# Patient Record
Sex: Female | Born: 1990
Health system: Southern US, Community
[De-identification: ages and names within clinical notes are randomized; demographics above are authoritative.]

## PROBLEM LIST (undated history)

## (undated) DIAGNOSIS — T7840XA Allergy, unspecified, initial encounter: Secondary | ICD-10-CM

## (undated) HISTORY — DX: Allergy, unspecified, initial encounter: T78.40XA

---

## 2012-04-09 ENCOUNTER — Ambulatory Visit
Admission: RE | Admit: 2012-04-09 | Discharge: 2012-04-09 | Disposition: A | Discharge status: Home / Self Care | Payer: No Typology Code available for payment source | Source: Ambulatory Visit | Attending: Family Medicine | Admitting: Family Medicine

## 2012-04-09 ENCOUNTER — Other Ambulatory Visit: Payer: No Typology Code available for payment source

## 2012-04-09 ENCOUNTER — Other Ambulatory Visit: Payer: Self-pay | Admitting: Family Medicine

## 2012-04-09 DIAGNOSIS — R52 Pain, unspecified: Secondary | ICD-10-CM

## 2012-05-07 ENCOUNTER — Ambulatory Visit
Admission: RE | Admit: 2012-05-07 | Discharge: 2012-05-07 | Disposition: A | Discharge status: Home / Self Care | Payer: Medicaid Other | Source: Ambulatory Visit | Attending: Orthopedic Surgery | Admitting: Orthopedic Surgery

## 2012-05-07 ENCOUNTER — Other Ambulatory Visit: Payer: Self-pay | Admitting: Orthopedic Surgery

## 2012-05-07 DIAGNOSIS — T148XXA Other injury of unspecified body region, initial encounter: Secondary | ICD-10-CM

## 2014-03-28 IMAGING — CR DG HAND COMPLETE 3+V*L*
3 series · 3 of 3 positions shown · non-contrast
Comparison: None.

***ADDENDUM*** CREATED: 04/09/2012 [DATE]

The findings of the report below are correct.  The fracture
involves the middle phalanx of the ring finger.  The impression
incorrectly reads the middle phalanx of the middle finger.  The
fracture is indeed of the ring finger.
CLINICAL DATA: Injury to left ring finger
LEFT HAND - COMPLETE 3+ VIEW

[view not recorded (1 of 3)]
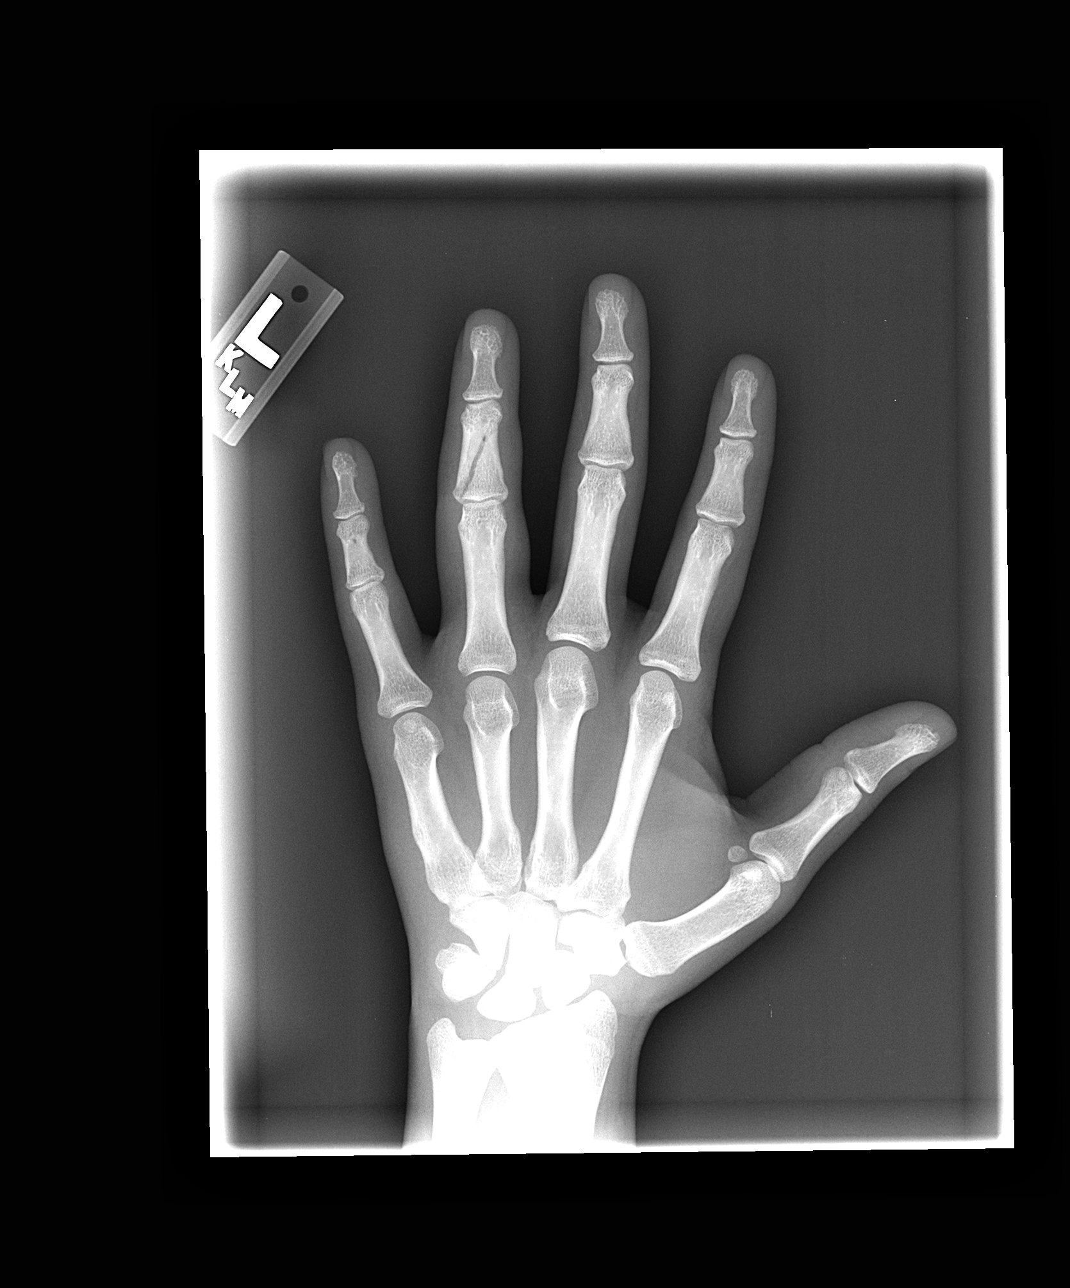

[view not recorded (2 of 3)]
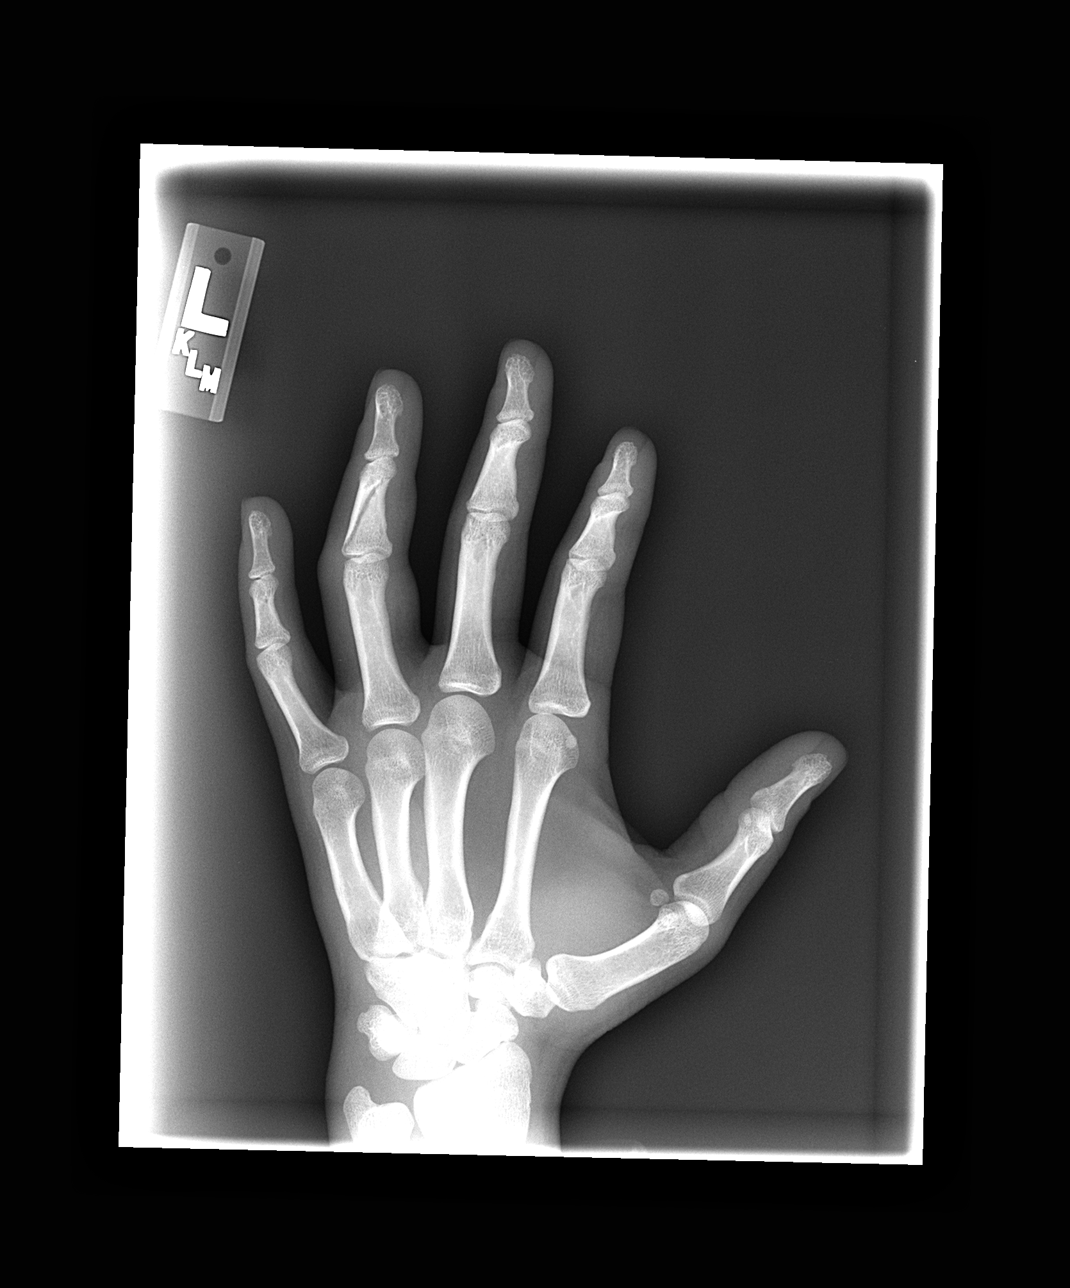

[view not recorded (3 of 3)]
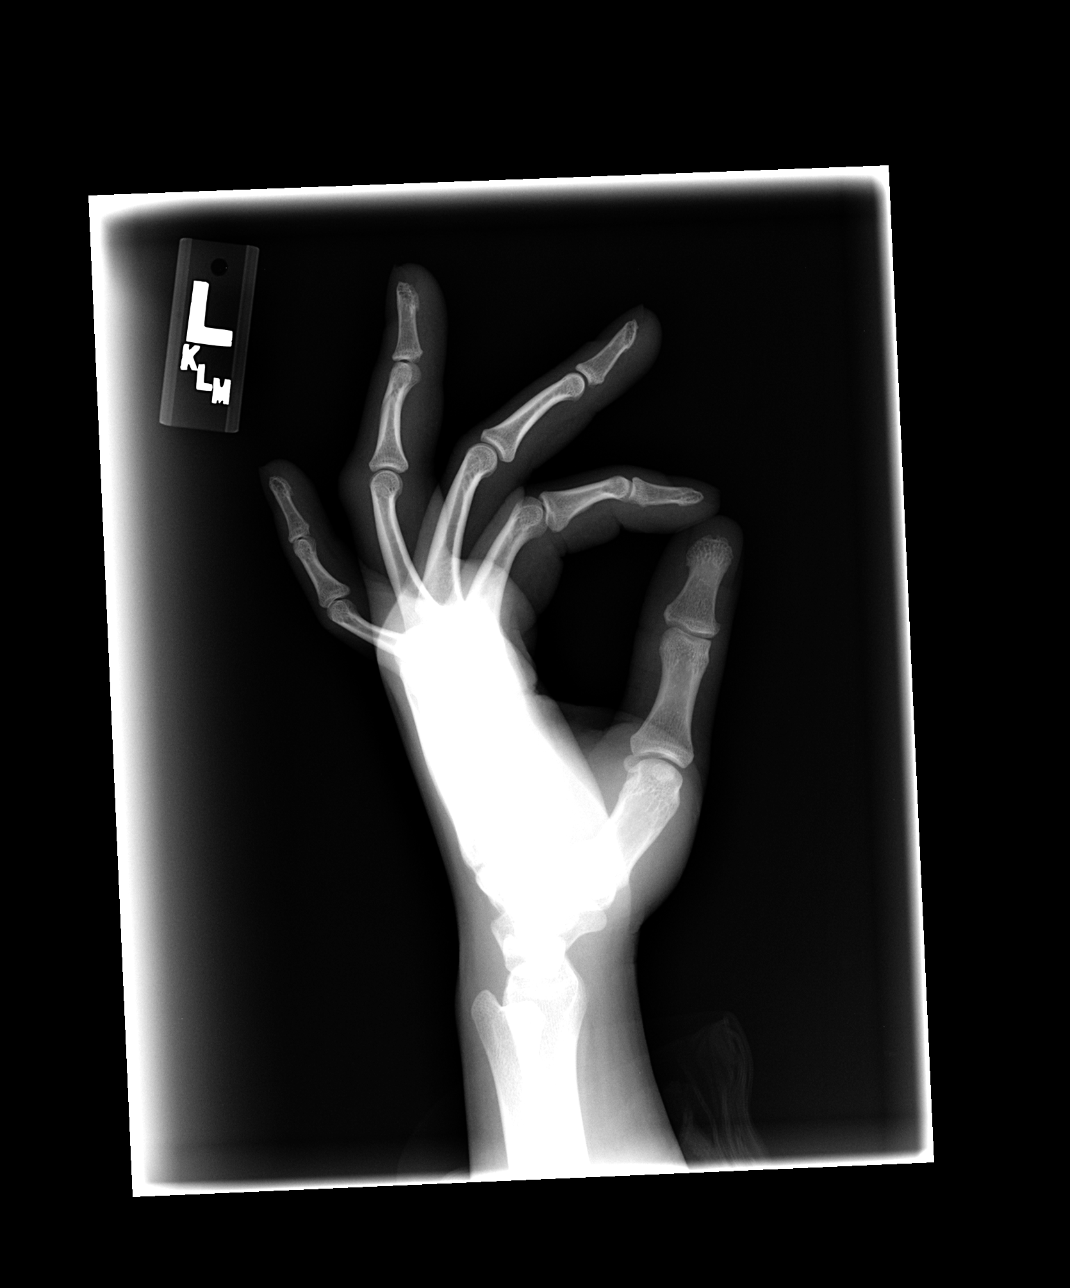

[3 of 3 positions shown; findings below may reference images not displayed]

FINDINGS: Acute obliquely oriented fracture through the middle
phalanx of the ring finger extending to the proximal articular
surface.  There is associated soft tissue swelling.  The fracture
fragments are nondisplaced.  The remainder the visualized bones and
joints are unremarkable.
IMPRESSION: Acute, non displaced obliquely oriented intra-articular fracture
through the middle phalanx of the middle finger with associated
soft tissue swelling.

## 2014-04-25 IMAGING — CR DG FINGER RING 2+V*L*
1 series · 1 of 1 positions shown · non-contrast
Comparison: 04/09/2012.

CLINICAL DATA: Follow-up fracture.

LEFT RING FINGER 2+V

[view not recorded]
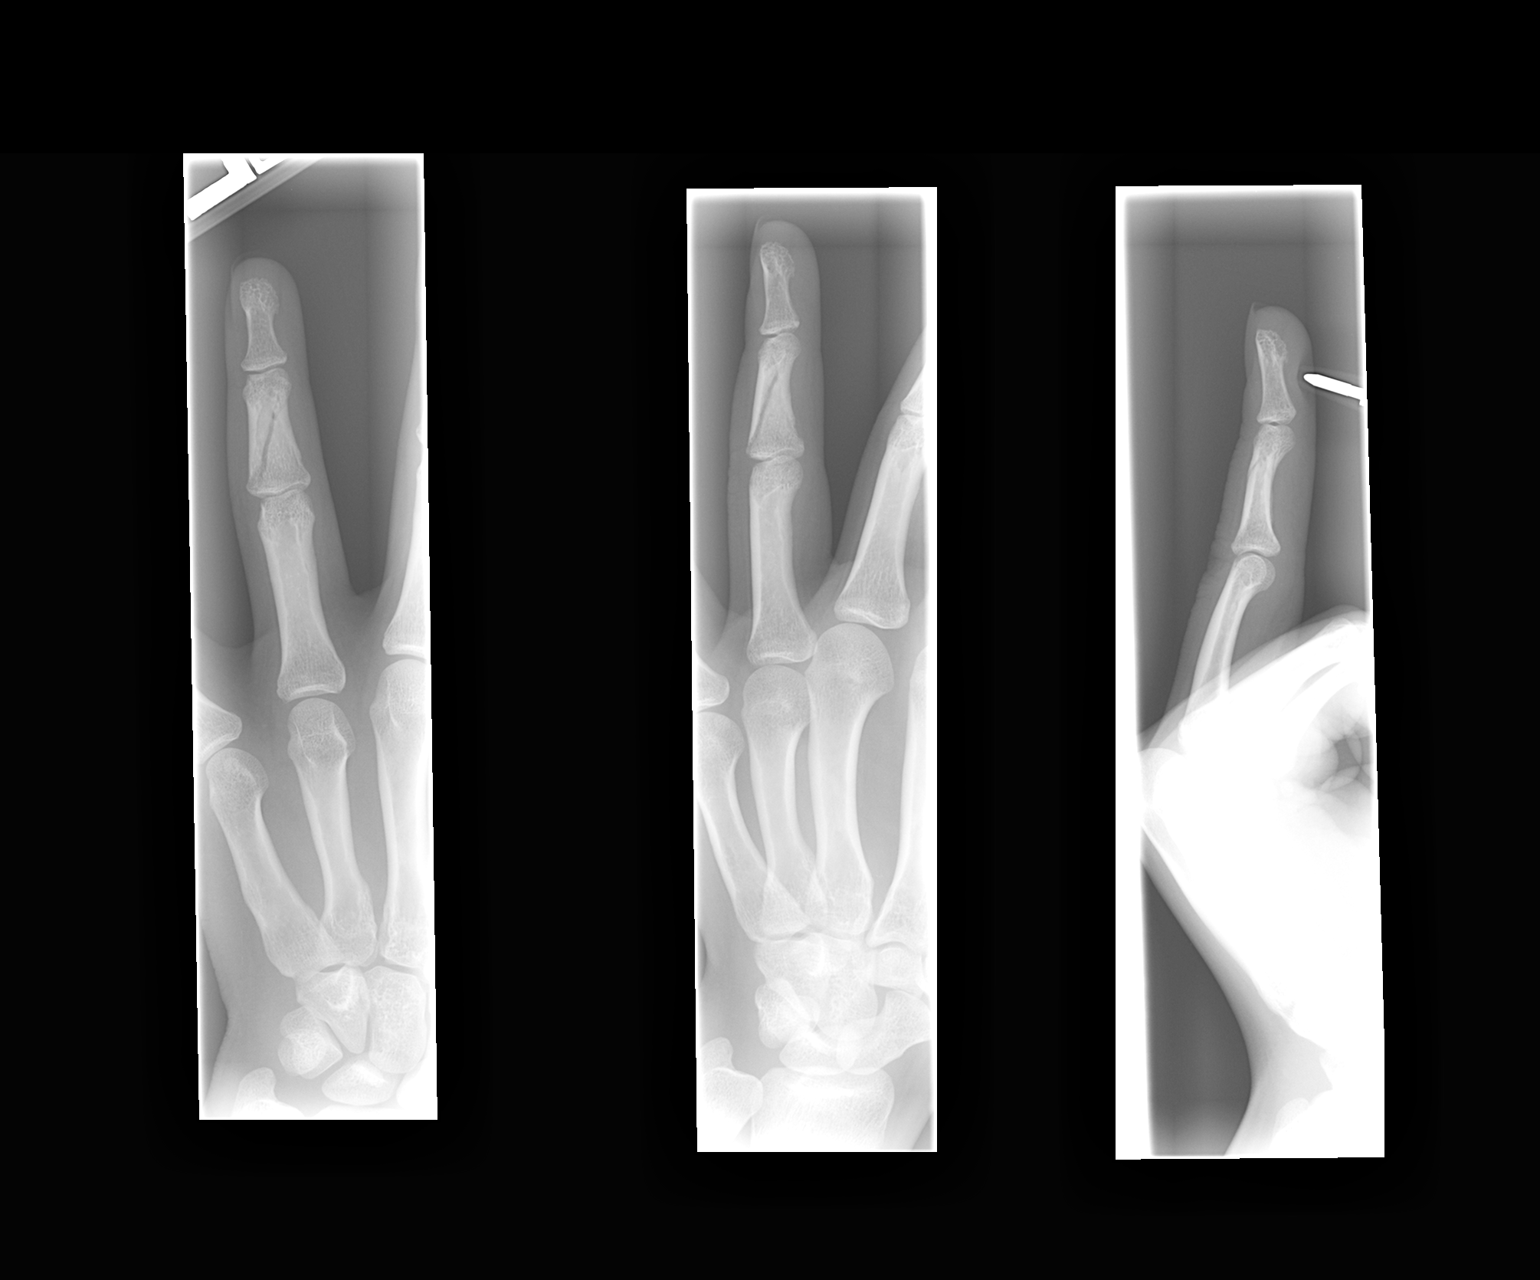

[1 of 1 positions shown; findings below may reference images not displayed]

FINDINGS: There is some bony ingrowth across the fracture site.  No
obvious callus formation.  The joint spaces are maintained.
IMPRESSION: Some interval bony healing across the fracture.

## 2022-09-08 ENCOUNTER — Ambulatory Visit: Payer: 59 | Admitting: Nurse Practitioner

## 2022-10-09 ENCOUNTER — Emergency Department (HOSPITAL_COMMUNITY): Payer: 59

## 2022-10-09 ENCOUNTER — Emergency Department (HOSPITAL_COMMUNITY)
Admission: EM | Admit: 2022-10-09 | Discharge: 2022-10-10 | Disposition: A | Discharge status: Home / Self Care | Payer: 59 | Attending: Emergency Medicine | Admitting: Emergency Medicine

## 2022-10-09 ENCOUNTER — Encounter (HOSPITAL_COMMUNITY): Payer: Self-pay

## 2022-10-09 DIAGNOSIS — R519 Headache, unspecified: Secondary | ICD-10-CM | POA: Insufficient documentation

## 2022-10-09 LAB — BASIC METABOLIC PANEL
Anion gap: 10 (ref 5–15)
BUN: 12 mg/dL (ref 6–20)
CO2: 23 mmol/L (ref 22–32)
Calcium: 9.6 mg/dL (ref 8.9–10.3)
Chloride: 103 mmol/L (ref 98–111)
Creatinine, Ser: 0.78 mg/dL (ref 0.44–1.00)
GFR, Estimated: >60 mL/min (ref >60)
Glucose, Bld: 98 mg/dL (ref 70–99)
Potassium: 4 mmol/L (ref 3.5–5.1)
Sodium: 136 mmol/L (ref 135–145)

## 2022-10-09 LAB — CBC WITH DIFFERENTIAL/PLATELET
Abs Immature Granulocytes: 0 10*3/uL (ref 0.00–0.07)
Basophils Absolute: 0.1 10*3/uL (ref 0.0–0.1)
Basophils Relative: 1 %
Eosinophils Absolute: 0.1 10*3/uL (ref 0.0–0.5)
Eosinophils Relative: 2 %
HCT: 41.3 % (ref 36.0–46.0)
Hemoglobin: 13.7 g/dL (ref 12.0–15.0)
Immature Granulocytes: 0 %
Lymphocytes Relative: 47 %
Lymphs Abs: 2.5 10*3/uL (ref 0.7–4.0)
MCH: 28.9 pg (ref 26.0–34.0)
MCHC: 33.2 g/dL (ref 30.0–36.0)
MCV: 87.1 fL (ref 80.0–100.0)
Monocytes Absolute: 0.8 10*3/uL (ref 0.1–1.0)
Monocytes Relative: 16 %
Neutro Abs: 1.8 10*3/uL (ref 1.7–7.7)
Neutrophils Relative %: 34 %
Platelets: 355 10*3/uL (ref 150–400)
RBC: 4.74 MIL/uL (ref 3.87–5.11)
RDW: 12.8 % (ref 11.5–15.5)
WBC: 5.3 10*3/uL (ref 4.0–10.5)
nRBC: 0 % (ref 0.0–0.2)

## 2022-10-09 LAB — I-STAT BETA HCG BLOOD, ED (MC, WL, AP ONLY): I-stat hCG, quantitative: <5 m[IU]/mL (ref <5)

## 2022-10-09 NOTE — ED Provider Notes (Signed)
[place] EMERGENCY DEPARTMENT AT [facility] Provider Note   CSN: [id number] Arrival date & time: 10/09/22  1512     History  No chief complaint on file.   Shirley Chase is a 32 y.o. female.  Patient with a history of pseudotumor cerebri here with 2 weeks of ongoing intermittent headaches.  Headache is diffuse, gradual in onset, persistent, improves with Excedrin.  Not having a headache currently.  Headache is gradual in onset.  No associated nausea, vomiting, fever, visual changes, photophobia or phonophobia.  No focal weakness, numbness or tingling.  No chest pain or shortness of breath.  States she saw optometrist in February and was told she had "tissue behind her right eye" and needed an MRI.  Denies having papilledema or elevated eye pressures.  States she is here because she is still having headaches and wants to have an MRI.  Currently she has no headache or visual changes.  No focal weakness, numbness or tingling.  No photophobia or phonophobia.  States she has required a lumbar puncture previously for "fluid behind my left eye".  The history is provided by the patient.       Home Medications Prior to Admission medications   Not on File      Allergies    Patient has no known allergies.    Review of Systems   Review of Systems  Constitutional:  Negative for activity change, appetite change and fever.  HENT:  Negative for congestion.   Eyes:  Negative for photophobia and visual disturbance.  Respiratory:  Negative for cough, chest tightness and shortness of breath.   Cardiovascular:  Negative for chest pain.  Gastrointestinal:  Negative for abdominal pain, nausea and vomiting.  Genitourinary:  Negative for dysuria.  Musculoskeletal:  Negative for arthralgias and myalgias.  Skin:  Negative for rash.  Neurological:  Positive for headaches. Negative for dizziness and weakness.  Psychiatric/Behavioral:  Negative for agitation. The patient is not  nervous/anxious.    all other systems are negative except as noted in the HPI and PMH.    Physical Exam Updated Vital Signs BP (!) 160/109 (BP Location: Right Arm)   Pulse 78   Temp 99.5 F (37.5 C)   Resp 16   SpO2 99%  Physical Exam Vitals and nursing note reviewed.  Constitutional:      General: She is not in acute distress.    Appearance: She is well-developed.  HENT:     Head: Normocephalic and atraumatic.     Mouth/Throat:     Pharynx: No oropharyngeal exudate.  Eyes:     Conjunctiva/sclera: Conjunctivae normal.     Pupils: Pupils are equal, round, and reactive to light.  Neck:     Comments: No meningismus. Cardiovascular:     Rate and Rhythm: Normal rate and regular rhythm.     Heart sounds: Normal heart sounds. No murmur heard. Pulmonary:     Effort: Pulmonary effort is normal. No respiratory distress.     Breath sounds: Normal breath sounds.  Abdominal:     Palpations: Abdomen is soft.     Tenderness: There is no abdominal tenderness. There is no guarding or rebound.  Musculoskeletal:        General: No tenderness. Normal range of motion.     Cervical back: Normal range of motion and neck supple.  Skin:    General: Skin is warm.  Neurological:     Mental Status: She is alert and oriented to person, place, and time.  Cranial Nerves: No cranial nerve deficit.     Motor: No abnormal muscle tone.     Coordination: Coordination normal.     Comments:  5/5 strength throughout. CN 2-12 intact.Equal grip strength.  Visual fields full to confrontation bilaterally.  Psychiatric:        Behavior: Behavior normal.     ED Results / Procedures / Treatments   Labs (all labs ordered are listed, but only abnormal results are displayed) Labs Reviewed  BASIC METABOLIC PANEL  CBC WITH DIFFERENTIAL/PLATELET  CBC WITH DIFFERENTIAL/PLATELET  I-STAT BETA HCG BLOOD, ED ([facility], [facility], [facility] ONLY)    EKG None  Radiology MR Brain W and Wo Contrast  Result Date:  10/10/2022 CLINICAL DATA:  Initial evaluation for headache. EXAM: MRI HEAD WITHOUT AND WITH CONTRAST MRV HEAD WITHOUT AND WITH CONTRAST TECHNIQUE: Multiplanar, multiecho pulse sequences of the brain and surrounding structures were obtained without and with intravenous contrast. Angiographic images of the intracranial venous structures were obtained using MRV technique without intravenous contrast. CONTRAST:  7.7mL GADAVIST GADOBUTROL 1 MMOL/ML IV SOLN COMPARISON:  CT from earlier the same day. FINDINGS: MRI HEAD FINDINGS Brain: Cerebral volume within normal limits for age. No focal parenchymal signal abnormality. No abnormal foci of restricted diffusion to suggest acute or subacute ischemia. Gray-white matter differentiation well maintained. No encephalomalacia to suggest chronic cortical infarction or other insult. No foci of susceptibility artifact indicative of acute or chronic intracranial blood products. No mass lesion, midline shift or mass effect. Ventricles normal in size and morphology without hydrocephalus. No extra-axial fluid collection. Pituitary gland and suprasellar region within normal limits. No abnormal enhancement. Vascular: Major intracranial vascular flow voids are well maintained. Skull and upper cervical spine: Craniocervical junction within normal limits. Visualized upper cervical spine demonstrates no significant finding. Bone marrow signal intensity within normal limits. No scalp soft tissue abnormality. Sinuses/Orbits: Globes and orbital soft tissues are within normal limits. Paranasal sinuses are largely clear. No significant mastoid effusion. Other: None. MRV HEAD FINDINGS Normal flow related signal and enhancement seen throughout the superior sagittal sinus to the torcula. Transverse and sigmoid sinuses are patent as are the jugular bulbs and visualized proximal internal jugular veins. Left transverse sinus dominant. Focal stenoses noted at the junctions of the transverse and sigmoid  sinuses bilaterally. Straight sinus, vein of Galen, internal cerebral veins, and basal veins of Rosenthal are patent. No appreciable abnormality about the cavernous sinus. No appreciable cortical vein thrombosis. IMPRESSION: MRI HEAD IMPRESSION: Normal brain MRI. No acute intracranial abnormality. MRV HEAD IMPRESSION: 1. Negative intracranial MRV for dural sinus thrombosis. 2. Focal stenoses at the junctions of the transverse and sigmoid sinuses bilaterally which can be seen in the setting of idiopathic intracranial hypertension. Electronically Signed   By: Rise Mu M.D.   On: 10/10/2022 03:10   MR Venogram Head  Result Date: 10/10/2022 CLINICAL DATA:  Initial evaluation for headache. EXAM: MRI HEAD WITHOUT AND WITH CONTRAST MRV HEAD WITHOUT AND WITH CONTRAST TECHNIQUE: Multiplanar, multiecho pulse sequences of the brain and surrounding structures were obtained without and with intravenous contrast. Angiographic images of the intracranial venous structures were obtained using MRV technique without intravenous contrast. CONTRAST:  7.68mL GADAVIST GADOBUTROL 1 MMOL/ML IV SOLN COMPARISON:  CT from earlier the same day. FINDINGS: MRI HEAD FINDINGS Brain: Cerebral volume within normal limits for age. No focal parenchymal signal abnormality. No abnormal foci of restricted diffusion to suggest acute or subacute ischemia. Gray-white matter differentiation well maintained. No encephalomalacia to suggest chronic cortical infarction or  other insult. No foci of susceptibility artifact indicative of acute or chronic intracranial blood products. No mass lesion, midline shift or mass effect. Ventricles normal in size and morphology without hydrocephalus. No extra-axial fluid collection. Pituitary gland and suprasellar region within normal limits. No abnormal enhancement. Vascular: Major intracranial vascular flow voids are well maintained. Skull and upper cervical spine: Craniocervical junction within normal limits.  Visualized upper cervical spine demonstrates no significant finding. Bone marrow signal intensity within normal limits. No scalp soft tissue abnormality. Sinuses/Orbits: Globes and orbital soft tissues are within normal limits. Paranasal sinuses are largely clear. No significant mastoid effusion. Other: None. MRV HEAD FINDINGS Normal flow related signal and enhancement seen throughout the superior sagittal sinus to the torcula. Transverse and sigmoid sinuses are patent as are the jugular bulbs and visualized proximal internal jugular veins. Left transverse sinus dominant. Focal stenoses noted at the junctions of the transverse and sigmoid sinuses bilaterally. Straight sinus, vein of Galen, internal cerebral veins, and basal veins of Rosenthal are patent. No appreciable abnormality about the cavernous sinus. No appreciable cortical vein thrombosis. IMPRESSION: MRI HEAD IMPRESSION: Normal brain MRI. No acute intracranial abnormality. MRV HEAD IMPRESSION: 1. Negative intracranial MRV for dural sinus thrombosis. 2. Focal stenoses at the junctions of the transverse and sigmoid sinuses bilaterally which can be seen in the setting of idiopathic intracranial hypertension. Electronically Signed   By: Rise MuBenjamin  McClintock M.D.   On: 10/10/2022 03:10   CT Head Wo Contrast  Result Date: 10/09/2022 CLINICAL DATA:  Headache EXAM: CT HEAD WITHOUT CONTRAST TECHNIQUE: Contiguous axial images were obtained from the base of the skull through the vertex without intravenous contrast. RADIATION DOSE REDUCTION: This exam was performed according to the departmental dose-optimization program which includes automated exposure control, adjustment of the mA and/or kV according to patient size and/or use of iterative reconstruction technique. COMPARISON:  None Available. FINDINGS: Brain: No evidence of acute infarction, hemorrhage, hydrocephalus, extra-axial collection or mass lesion/mass effect. Vascular: No hyperdense vessel or unexpected  calcification. Skull: Normal. Negative for fracture or focal lesion. Sinuses/Orbits: No acute finding. Other: None. IMPRESSION: No acute intracranial abnormality. Electronically Signed   By: Darliss CheneyAmy  Guttmann M.D.   On: 10/09/2022 17:16    Procedures Procedures    Medications Ordered in ED Medications - No data to display  ED Course/ Medical Decision Making/ A&P                             Medical Decision Making Amount and/or Complexity of Data Reviewed Labs: ordered. Decision-making details documented in ED Course. Radiology: ordered and independent interpretation performed. Decision-making details documented in ED Course. ECG/medicine tests: ordered and independent interpretation performed. Decision-making details documented in ED Course.  Risk Prescription drug management.   Intermittent headaches for 2 weeks, none currently.  Normal neurological exam.  No visual changes.  CT head obtained in triage is negative for acute findings.  Results reviewed and interpreted by me.   Visual Acuity R Distance: 10/20 L Distance: 10/20   MRI brain is normal.  MRV shows some stenosis of transverse and sagittal sinuses.  Patient remains headache free.  Her vision is normal.  Feel she does not need emergent lumbar puncture given lack of headache and lack of visual changes.  Patient agrees.  Will refer to neurology for outpatient follow-up.  She remains headache free.  Return to the ED with worsening headache, visual changes, unilateral weakness, numbness or tingling or any other  concerns.       Final Clinical Impression(s) / ED Diagnoses Final diagnoses:  None    Rx / DC Orders ED Discharge Orders     None         Takila Kronberg, Jeannett Senior, MD 10/10/22 [phone]

## 2022-10-09 NOTE — ED Provider Triage Note (Signed)
Emergency Medicine Provider Triage Evaluation Note  Shirley Chase , a 32 y.o. female  was evaluated in triage.  Pt complains of headache. Hx of pseudotumor cerebri.  Has had recurrent headache worse the past 2 weeks.  Was told the she has "tissue behind R eye" by eye doctor in feb with recommendation to get an MRI of her brain.  But have not done so. No fever, vision loss, nausea, focal numbness or focal weakness  Review of Systems  Positive: As above Negative: As asbove  Physical Exam  BP (!) 160/109 (BP Location: Right Arm)   Pulse 78   Temp 99.5 F (37.5 C)   Resp 16   SpO2 99%  Gen:   Awake, no distress   Resp:  Normal effort  MSK:   Moves extremities without difficulty  Other:    Medical Decision Making  Medically screening exam initiated at 3:59 PM.  Appropriate orders placed.  Shirley Chase was informed that the remainder of the evaluation will be completed by another provider, this initial triage assessment does not replace that evaluation, and the importance of remaining in the ED until their evaluation is complete.     Fayrene Helper, PA-C 10/09/22 [phone]

## 2022-10-09 NOTE — ED Triage Notes (Addendum)
Pt c/o intermittent ha x 2 weeks; went to get eye exam, states they found tissue behind R eye, has been having migraines since; took Excedrin today, some relief; alert, orient, NAD in triage

## 2022-10-10 ENCOUNTER — Emergency Department (HOSPITAL_COMMUNITY): Payer: 59

## 2022-10-10 LAB — CBC WITH DIFFERENTIAL/PLATELET
Abs Immature Granulocytes: 0.01 10*3/uL (ref 0.00–0.07)
Basophils Absolute: 0.1 10*3/uL (ref 0.0–0.1)
Basophils Relative: 1 %
Eosinophils Absolute: 0.2 10*3/uL (ref 0.0–0.5)
Eosinophils Relative: 2 %
HCT: 42.7 % (ref 36.0–46.0)
Hemoglobin: 13.9 g/dL (ref 12.0–15.0)
Immature Granulocytes: 0 %
Lymphocytes Relative: 55 %
Lymphs Abs: 3.3 10*3/uL (ref 0.7–4.0)
MCH: 28.7 pg (ref 26.0–34.0)
MCHC: 32.6 g/dL (ref 30.0–36.0)
MCV: 88 fL (ref 80.0–100.0)
Monocytes Absolute: 0.9 10*3/uL (ref 0.1–1.0)
Monocytes Relative: 14 %
Neutro Abs: 1.7 10*3/uL (ref 1.7–7.7)
Neutrophils Relative %: 28 %
Platelets: 322 10*3/uL (ref 150–400)
RBC: 4.85 MIL/uL (ref 3.87–5.11)
RDW: 13 % (ref 11.5–15.5)
WBC: 6.2 10*3/uL (ref 4.0–10.5)
nRBC: 0 % (ref 0.0–0.2)

## 2022-10-10 MED ORDER — GADOBUTROL 1 MMOL/ML IV SOLN
7.5000 mL | Freq: Once | INTRAVENOUS | Status: AC | PRN
  Administered 2022-10-10: 7.5 mL via INTRAVENOUS

## 2022-10-10 NOTE — Discharge Instructions (Signed)
Your MRI brain is normal.  There is some suggestion of possible idiopathic intracranial hypertension.  You should follow-up with the neurologist.  Return to the ED sooner with worsening headache, visual changes, unilateral weakness, numbness, tingling or other concerns.

## 2024-04-15 ENCOUNTER — Other Ambulatory Visit: Payer: Self-pay | Admitting: Family Medicine

## 2024-04-15 ENCOUNTER — Encounter: Payer: Self-pay | Admitting: Family Medicine

## 2024-04-15 ENCOUNTER — Ambulatory Visit (INDEPENDENT_AMBULATORY_CARE_PROVIDER_SITE_OTHER): Payer: Self-pay | Admitting: Family Medicine

## 2024-04-15 VITALS — BP 116/80 | HR 96 | Temp 98.2°F | Ht 62.0 in | Wt 201.0 lb

## 2024-04-15 DIAGNOSIS — E6609 Other obesity due to excess calories: Secondary | ICD-10-CM | POA: Diagnosis not present

## 2024-04-15 DIAGNOSIS — Z2821 Immunization not carried out because of patient refusal: Secondary | ICD-10-CM

## 2024-04-15 DIAGNOSIS — Z1159 Encounter for screening for other viral diseases: Secondary | ICD-10-CM

## 2024-04-15 DIAGNOSIS — E559 Vitamin D deficiency, unspecified: Secondary | ICD-10-CM | POA: Diagnosis not present

## 2024-04-15 DIAGNOSIS — Z136 Encounter for screening for cardiovascular disorders: Secondary | ICD-10-CM

## 2024-04-15 DIAGNOSIS — Z7689 Persons encountering health services in other specified circumstances: Secondary | ICD-10-CM

## 2024-04-15 DIAGNOSIS — Z114 Encounter for screening for human immunodeficiency virus [HIV]: Secondary | ICD-10-CM

## 2024-04-15 DIAGNOSIS — J302 Other seasonal allergic rhinitis: Secondary | ICD-10-CM | POA: Diagnosis not present

## 2024-04-15 DIAGNOSIS — E66812 Obesity, class 2: Secondary | ICD-10-CM

## 2024-04-15 DIAGNOSIS — Z0000 Encounter for general adult medical examination without abnormal findings: Secondary | ICD-10-CM | POA: Diagnosis not present

## 2024-04-15 DIAGNOSIS — Z6836 Body mass index (BMI) 36.0-36.9, adult: Secondary | ICD-10-CM

## 2024-04-15 MED ORDER — CETIRIZINE HCL 10 MG PO TABS: 10.0000 mg | ORAL_TABLET | Freq: Every day | ORAL | 2 refills | Status: DC

## 2024-04-15 MED ORDER — MOMETASONE FUROATE 50 MCG/ACT NA SUSP
2.0000 | Freq: Every day | NASAL | 2 refills | Status: AC
Start: 2024-04-15 — End: 2025-04-15

## 2024-04-15 MED ADMIN — Triamcinolone Acetonide Inj Susp 40 MG/ML: 60 mg | INTRAMUSCULAR | NDC 70121116901

## 2024-04-15 NOTE — Patient Instructions (Signed)

## 2024-04-15 NOTE — Progress Notes (Signed)
 Shirley Chase, CMA,acting as a neurosurgeon for [employer], NP.,have documented all relevant documentation on the behalf of Shirley Creighton, NP,as directed by  Shirley Creighton, NP while in the presence of Shirley Creighton, NP.  Subjective:    Patient ID: Shirley Chase , female    DOB: 17-Jul-1990 , 33 y.o.   MRN: [id number]  Chief Complaint  Patient presents with   Establish Care   Annual Exam    HPI Discussed the use of AI scribe software for clinical note transcription with the patient, who gave verbal consent to proceed.  History of Present Illness    Shirley Chase is a 33 year old female who presents with congestion and runny nose.  She has been experiencing congestion and a runny nose since yesterday. The nasal discharge is clear. She does not have a cough, fever, chills, or achiness, but experiences sweating at night when going to bed. She has not been around anyone sick and has not tested for COVID or the flu.  She has a history of acid reflux, which she manages by avoiding certain foods such as red sauce, pizza, pineapples, and greasy foods. She was previously on pantoprazole but is not currently taking it. She reports that her acid reflux is better when she avoids certain foods.  For her current symptoms, she took Nyquil last night, which provided some relief, and Tylenol to ensure she did not have a fever. She has tried Claritin for allergies but does not find it effective. She has not tried Allegra or Zyrtec before.  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 She is not currently on any contraceptive and has a GYN appointment scheduled for later today.         Social History   Tobacco Use  Smoking Status Never  Smokeless Tobacco Never   Social History   Substance and Sexual Activity  Alcohol Use Not Currently    Review of Systems  Constitutional: Negative.   HENT: Negative.    Eyes: Negative.   Respiratory: Negative.     Cardiovascular: Negative.   Gastrointestinal: Negative.   Endocrine: Negative.   Genitourinary: Negative.   Musculoskeletal: Negative.   Skin: Negative.   Allergic/Immunologic: Negative.   Neurological: Negative.   Hematological: Negative.   Psychiatric/Behavioral: Negative.       Today's Vitals   04/15/24 0836  Temp: 98.2 F (36.8 C)  TempSrc: Oral  Weight: 201 lb (91.2 kg)   There is no height or weight on file to calculate BMI.  Wt Readings from Last 3 Encounters:  04/15/24 201 lb (91.2 kg)     Objective:  Physical Exam Constitutional:      Appearance: Normal appearance.  HENT:     Head: Normocephalic.  Cardiovascular:     Rate and Rhythm: Normal rate and regular rhythm.     Pulses: Normal pulses.     Heart sounds: Normal heart sounds.  Pulmonary:     Effort: Pulmonary effort is normal.     Breath sounds: Normal breath sounds.  Abdominal:     General: Bowel sounds are normal.  Musculoskeletal:        General: Normal range of motion.  Skin:    General: Skin is warm and dry.  Neurological:     General: No focal deficit present.     Mental Status: She is alert and oriented to person, place, and time. Mental status is at baseline.  Psychiatric:  Mood and Affect: Mood normal.         Assessment And Plan:     Establishing care with new doctor, encounter for     No follow-ups on file. Patient was given opportunity to ask questions. Patient verbalized understanding of the plan and was able to repeat key elements of the plan. All questions were answered to their satisfaction.   Shirley Creighton, NP  I, Shirley Creighton, NP, have reviewed all documentation for this visit. The documentation on 04/15/24 for the exam, diagnosis, procedures, and orders are all accurate and complete.

## 2024-04-16 LAB — CMP14+EGFR
ALT: 19 IU/L (ref 0–32)
AST: 24 IU/L (ref 0–40)
Albumin: 4 g/dL (ref 3.9–4.9)
Alkaline Phosphatase: 73 IU/L (ref 41–116)
BUN/Creatinine Ratio: 9 (ref 9–23)
BUN: 7 mg/dL (ref 6–20)
Bilirubin Total: 0.2 mg/dL (ref 0.0–1.2)
CO2: 23 mmol/L (ref 20–29)
Calcium: 9.2 mg/dL (ref 8.7–10.2)
Chloride: 103 mmol/L (ref 96–106)
Creatinine, Ser: 0.77 mg/dL (ref 0.57–1.00)
Globulin, Total: 2.9 g/dL (ref 1.5–4.5)
Glucose: 81 mg/dL (ref 70–99)
Potassium: 4.8 mmol/L (ref 3.5–5.2)
Sodium: 136 mmol/L (ref 134–144)
Total Protein: 6.9 g/dL (ref 6.0–8.5)
eGFR: 106 mL/min/1.73 (ref >59)

## 2024-04-16 LAB — VITAMIN D 25 HYDROXY (VIT D DEFICIENCY, FRACTURES): Vit D, 25-Hydroxy: 25.9 ng/mL — ABNORMAL LOW (ref 30.0–100.0)

## 2024-04-16 LAB — LIPID PANEL
Chol/HDL Ratio: 1.9 ratio (ref 0.0–4.4)
Cholesterol, Total: 172 mg/dL (ref 100–199)
HDL: 89 mg/dL (ref >39)
LDL Chol Calc (NIH): 71 mg/dL (ref 0–99)
Triglycerides: 60 mg/dL (ref 0–149)
VLDL Cholesterol Cal: 12 mg/dL (ref 5–40)

## 2024-04-16 LAB — HEPATITIS C ANTIBODY: Hep C Virus Ab: NONREACTIVE

## 2024-04-16 LAB — HIV ANTIBODY (ROUTINE TESTING W REFLEX): HIV Screen 4th Generation wRfx: NONREACTIVE

## 2024-04-16 LAB — CBC
Hematocrit: 42.1 % (ref 34.0–46.6)
Hemoglobin: 13.4 g/dL (ref 11.1–15.9)
MCH: 28.3 pg (ref 26.6–33.0)
MCHC: 31.8 g/dL (ref 31.5–35.7)
MCV: 89 fL (ref 79–97)
Platelets: 321 x10E3/uL (ref 150–450)
RBC: 4.74 x10E6/uL (ref 3.77–5.28)
RDW: 12.7 % (ref 11.7–15.4)
WBC: 5.2 x10E3/uL (ref 3.4–10.8)

## 2024-04-16 LAB — HEPATITIS B SURFACE ANTIBODY,QUALITATIVE: Hep B Surface Ab, Qual: REACTIVE

## 2024-04-21 ENCOUNTER — Ambulatory Visit: Admitting: Family Medicine

## 2024-04-29 ENCOUNTER — Ambulatory Visit: Payer: Self-pay | Admitting: Family Medicine

## 2024-04-29 MED ORDER — VITAMIN D (ERGOCALCIFEROL) 1.25 MG (50000 UNIT) PO CAPS: 50000.0000 [IU] | ORAL_CAPSULE | ORAL | 2 refills | Status: AC

## 2024-04-29 NOTE — Progress Notes (Signed)
 Your labs are normal except for the vitamin D that is low. Once weekly vitamin D supplement has been sent to the pharmacy for pick up.  Thank you!

## 2024-07-08 ENCOUNTER — Other Ambulatory Visit: Payer: Self-pay | Admitting: Family Medicine

## 2024-07-08 DIAGNOSIS — J302 Other seasonal allergic rhinitis: Secondary | ICD-10-CM

## 2024-07-14 ENCOUNTER — Other Ambulatory Visit: Payer: Self-pay | Admitting: Family Medicine

## 2025-04-21 ENCOUNTER — Encounter: Payer: Self-pay | Admitting: Family Medicine
